# Patient Record
Sex: Female | Born: 1960 | Race: White | Hispanic: No | Marital: Married | Smoking: Never smoker
Health system: Southern US, Community
[De-identification: ages and names within clinical notes are randomized; demographics above are authoritative.]

---

## 2018-08-05 ENCOUNTER — Emergency Department (HOSPITAL_BASED_OUTPATIENT_CLINIC_OR_DEPARTMENT_OTHER): Payer: 59

## 2018-08-05 ENCOUNTER — Telehealth (HOSPITAL_BASED_OUTPATIENT_CLINIC_OR_DEPARTMENT_OTHER): Payer: Self-pay | Admitting: Emergency Medicine

## 2018-08-05 ENCOUNTER — Emergency Department (HOSPITAL_BASED_OUTPATIENT_CLINIC_OR_DEPARTMENT_OTHER)
Admission: EM | Admit: 2018-08-05 | Discharge: 2018-08-05 | Disposition: A | Discharge status: Home / Self Care | Payer: 59 | Attending: Emergency Medicine | Admitting: Emergency Medicine

## 2018-08-05 ENCOUNTER — Other Ambulatory Visit: Payer: Self-pay

## 2018-08-05 DIAGNOSIS — S0990XA Unspecified injury of head, initial encounter: Secondary | ICD-10-CM | POA: Insufficient documentation

## 2018-08-05 DIAGNOSIS — Y9389 Activity, other specified: Secondary | ICD-10-CM | POA: Diagnosis not present

## 2018-08-05 DIAGNOSIS — S0101XA Laceration without foreign body of scalp, initial encounter: Secondary | ICD-10-CM | POA: Diagnosis not present

## 2018-08-05 DIAGNOSIS — Y9259 Other trade areas as the place of occurrence of the external cause: Secondary | ICD-10-CM | POA: Insufficient documentation

## 2018-08-05 DIAGNOSIS — Z23 Encounter for immunization: Secondary | ICD-10-CM | POA: Diagnosis not present

## 2018-08-05 DIAGNOSIS — Y990 Civilian activity done for income or pay: Secondary | ICD-10-CM | POA: Diagnosis not present

## 2018-08-05 DIAGNOSIS — R55 Syncope and collapse: Secondary | ICD-10-CM | POA: Diagnosis not present

## 2018-08-05 DIAGNOSIS — W01198A Fall on same level from slipping, tripping and stumbling with subsequent striking against other object, initial encounter: Secondary | ICD-10-CM | POA: Diagnosis not present

## 2018-08-05 DIAGNOSIS — I951 Orthostatic hypotension: Secondary | ICD-10-CM

## 2018-08-05 LAB — CBG MONITORING, ED: Glucose-Capillary: 93 mg/dL (ref 70–99)

## 2018-08-05 LAB — CBC WITH DIFFERENTIAL/PLATELET
Abs Immature Granulocytes: 0.02 10*3/uL (ref 0.00–0.07)
Basophils Absolute: 0.1 10*3/uL (ref 0.0–0.1)
Basophils Relative: 1 %
Eosinophils Absolute: 0.1 10*3/uL (ref 0.0–0.5)
Eosinophils Relative: 2 %
HCT: 37.5 % (ref 36.0–46.0)
Hemoglobin: 12.4 g/dL (ref 12.0–15.0)
Immature Granulocytes: 0 %
Lymphocytes Relative: 18 %
Lymphs Abs: 1.5 10*3/uL (ref 0.7–4.0)
MCH: 32.2 pg (ref 26.0–34.0)
MCHC: 33.1 g/dL (ref 30.0–36.0)
MCV: 97.4 fL (ref 80.0–100.0)
Monocytes Absolute: 0.6 10*3/uL (ref 0.1–1.0)
Monocytes Relative: 7 %
Neutro Abs: 5.7 10*3/uL (ref 1.7–7.7)
Neutrophils Relative %: 72 %
Platelets: 280 10*3/uL (ref 150–400)
RBC: 3.85 MIL/uL — ABNORMAL LOW (ref 3.87–5.11)
RDW: 12.3 % (ref 11.5–15.5)
WBC: 7.9 10*3/uL (ref 4.0–10.5)
nRBC: 0 % (ref 0.0–0.2)

## 2018-08-05 LAB — BASIC METABOLIC PANEL
Anion gap: 7 (ref 5–15)
BUN: 15 mg/dL (ref 6–20)
CO2: 24 mmol/L (ref 22–32)
Calcium: 8.7 mg/dL — ABNORMAL LOW (ref 8.9–10.3)
Chloride: 104 mmol/L (ref 98–111)
Creatinine, Ser: 0.72 mg/dL (ref 0.44–1.00)
GFR calc Af Amer: >60 mL/min (ref >60)
GFR calc non Af Amer: >60 mL/min (ref >60)
Glucose, Bld: 102 mg/dL — ABNORMAL HIGH (ref 70–99)
Potassium: 4 mmol/L (ref 3.5–5.1)
Sodium: 135 mmol/L (ref 135–145)

## 2018-08-05 LAB — PREGNANCY, URINE: Preg Test, Ur: NEGATIVE

## 2018-08-05 LAB — TROPONIN I: Troponin I: <0.03 ng/mL (ref <0.03)

## 2018-08-05 MED ORDER — TETANUS-DIPHTH-ACELL PERTUSSIS 5-2.5-18.5 LF-MCG/0.5 IM SUSP
0.5000 mL | Freq: Once | INTRAMUSCULAR | Status: AC
  Administered 2018-08-05: 11:00:00 0.5 mL via INTRAMUSCULAR
  Filled 2018-08-05: qty 0.5

## 2018-08-05 MED ORDER — SODIUM CHLORIDE 0.9 % IV BOLUS
1000.0000 mL | Freq: Once | INTRAVENOUS | Status: AC
  Administered 2018-08-05: 13:00:00 1000 mL via INTRAVENOUS

## 2018-08-05 MED ORDER — ONDANSETRON HCL 4 MG/2ML IJ SOLN
4.0000 mg | Freq: Once | INTRAMUSCULAR | Status: AC
  Administered 2018-08-05: 12:00:00 4 mg via INTRAVENOUS
  Filled 2018-08-05: qty 2

## 2018-08-05 MED ORDER — TRAMADOL HCL 50 MG PO TABS: 50.0000 mg | ORAL_TABLET | Freq: Four times a day (QID) | ORAL | 0 refills | Status: AC | PRN

## 2018-08-05 MED ORDER — ACETAMINOPHEN 325 MG PO TABS
650.0000 mg | ORAL_TABLET | Freq: Once | ORAL | Status: AC
  Administered 2018-08-05: 11:00:00 650 mg via ORAL
  Filled 2018-08-05: qty 2

## 2018-08-05 MED ORDER — TRAMADOL HCL 50 MG PO TABS: 50.0000 mg | ORAL_TABLET | Freq: Four times a day (QID) | ORAL | 0 refills | Status: DC | PRN

## 2018-08-05 NOTE — Discharge Instructions (Addendum)
Take tramadol as needed for headache.  Stay hydrated and follow up closely with your primary care provider for further management.

## 2018-08-05 NOTE — ED Provider Notes (Signed)
ED ECG REPORT   Date: 08/05/2018  Rate: 67  Rhythm: normal sinus rhythm  QRS Axis: normal  Intervals: normal  ST/T Wave abnormalities: normal  Conduction Disutrbances:none  Narrative Interpretation:   Old EKG Reviewed: none available  I have personally reviewed the EKG tracing and agree with the computerized printout as noted.    Vanetta Mulders, MD 08/05/18 [id number]

## 2018-08-05 NOTE — Telephone Encounter (Signed)
Tramadol sent to pt's preferred pharmacy

## 2018-08-05 NOTE — Telephone Encounter (Signed)
Patient request medicine to be sent to a different pharmacy

## 2018-08-05 NOTE — ED Triage Notes (Signed)
Patient was talking to her boss and all see knew  she fell and does not know anything after that.  She stated that her boss told her that she loss consciousness for a seconds.  She think she just went down per her boss. Open skin area to posterior of her head.

## 2018-08-05 NOTE — ED Notes (Signed)
ED Provider at bedside. 

## 2018-08-05 NOTE — ED Provider Notes (Signed)
[facility] EMERGENCY DEPARTMENT Provider Note   CSN: [id number] Arrival date & time: 08/05/18  1005    History   Chief Complaint Chief Complaint  Patient presents with  . Fall    HPI Deanna Beltran is a 58 y.o. female.     The history is provided by the patient. No language interpreter was used.  Fall      58 year old female brought here via EMS for evaluation of syncope.  Patient report she was talking to her coworker at work today when she had an apparent witnessed syncopal episode in which she fell back and struck the back of her head against the ground.  She then came to, without any reported seizure activities.  She felt her head and noticed blood in the back of the scalp.  She report of sharp throbbing nonradiating pain to the back of her head, moderate in severity.  She denies any precipitating symptoms prior to the fall.  She denies any focal numbness weakness or confusion.  No complaints of chest pain heart palpitation shortness of breath abdominal pain back pain.  She felt normal this morning and did ate her breakfast.  She felt fine yesterday.  She does not have any history of syncope.  She is not on any blood thinner medication.  She cannot recall her last tetanus status.  She denies any recent change in her diet or medication.  She did report having history of iron deficiency in the past.  She did recall taking ibuprofen 800 mg this morning for her right hand pain which she attributed to carpal tunnel syndrome.  She does not take NSAIDs on a regular basis.  She denies any black or tarry stool.  No past medical history on file.  There are no active problems to display for this patient.   The histories are not reviewed yet. Please review them in the "History" navigator section and refresh this SmartLink.   OB History   No obstetric history on file.      Home Medications    Prior to Admission medications   Not on File    Family History No family history  on file.  Social History Social History   Tobacco Use  . Smoking status: Not on file  Substance Use Topics  . Alcohol use: Not on file  . Drug use: Not on file     Allergies   Patient has no allergy information on record.   Review of Systems Review of Systems  All other systems reviewed and are negative.    Physical Exam Updated Vital Signs BP 126/79   Pulse 65   Resp 13   Ht 5\' 5"  (1.651 m)   Wt 78 kg   SpO2 97%   BMI 28.62 kg/m   Physical Exam Vitals signs and nursing note reviewed.  Constitutional:      General: She is not in acute distress.    Appearance: She is well-developed.  HENT:     Head: Normocephalic.     Comments: 2 mm laceration noted to the occipital scalp not actively bleeding.  It is tender to palpation without crepitus.  No hemotympanum, no septal hematoma, no malocclusion, no midface tenderness. Eyes:     Extraocular Movements: Extraocular movements intact.     Conjunctiva/sclera: Conjunctivae normal.     Pupils: Pupils are equal, round, and reactive to light.  Neck:     Musculoskeletal: Neck supple.  Cardiovascular:     Rate and Rhythm: Normal rate  and regular rhythm.     Pulses: Normal pulses.     Heart sounds: Normal heart sounds.  Pulmonary:     Effort: Pulmonary effort is normal.  Abdominal:     Palpations: Abdomen is soft.     Tenderness: There is no abdominal tenderness.  Skin:    Findings: No rash.  Neurological:     Mental Status: She is alert and oriented to person, place, and time.     GCS: GCS eye subscore is 4. GCS verbal subscore is 5. GCS motor subscore is 6.     Cranial Nerves: Cranial nerves are intact.     Sensory: Sensation is intact.     Motor: Motor function is intact.     Coordination: Coordination is intact.     Gait: Gait is intact.  Psychiatric:        Mood and Affect: Mood normal.      ED Treatments / Results  Labs (all labs ordered are listed, but only abnormal results are displayed) Labs  Reviewed  BASIC METABOLIC PANEL - Abnormal; Notable for the following components:      Result Value   Glucose, Bld 102 (*)    Calcium 8.7 (*)    All other components within normal limits  CBC WITH DIFFERENTIAL/PLATELET - Abnormal; Notable for the following components:   RBC 3.85 (*)    All other components within normal limits  PREGNANCY, URINE  TROPONIN I  CBG MONITORING, ED    EKG None   Date: 08/05/2018  Rate: 67  Rhythm: normal sinus rhythm  QRS Axis: normal  Intervals: normal  ST/T Wave abnormalities: normal  Conduction Disutrbances: none  Narrative Interpretation:   Old EKG Reviewed: No significant changes noted     Radiology Ct Head Wo Contrast  Result Date: 08/05/2018 CLINICAL DATA:  Syncope with posterior head injury. EXAM: CT HEAD WITHOUT CONTRAST TECHNIQUE: Contiguous axial images were obtained from the base of the skull through the vertex without intravenous contrast. COMPARISON:  None. FINDINGS: Brain: No evidence of acute infarction, hemorrhage, hydrocephalus, extra-axial collection or mass lesion/mass effect. Vascular: Negative Skull: Right posterior scalp hematoma without calvarial fracture Sinuses/Orbits: No evidence of injury. IMPRESSION: 1. No evidence of intracranial injury. 2. Posterior scalp contusion without fracture. Electronically Signed   By: Marnee Spring M.D.   On: 08/05/2018 11:37    Procedures Procedures (including critical care time)  Medications Ordered in ED Medications  acetaminophen (TYLENOL) tablet 650 mg (650 mg Oral Given 08/05/18 1037)  Tdap (BOOSTRIX) injection 0.5 mL (0.5 mLs Intramuscular Given 08/05/18 1038)  ondansetron (ZOFRAN) injection 4 mg (4 mg Intravenous Given 08/05/18 1153)  sodium chloride 0.9 % bolus 1,000 mL (1,000 mLs Intravenous New Bag/Given 08/05/18 1232)     Initial Impression / Assessment and Plan / ED Course  I have reviewed the triage vital signs and the nursing notes.  Pertinent labs & imaging results that were  available during my care of the patient were reviewed by me and considered in my medical decision making (see chart for details).        BP (!) 156/95 (BP Location: Right Arm)   Pulse 72   Resp 13   Ht 5\' 5"  (1.651 m)   Wt 78 kg   SpO2 100%   BMI 28.62 kg/m    Final Clinical Impressions(s) / ED Diagnoses   Final diagnoses:  None    ED Discharge Orders    None     10:31 AM 58 y.o. female  presenting with syncope and head injury.  Patient immediately placed on cardiac, NBP, and oximetry monitors, which were all within normal limits.  ECG obtained and reviewed, which was NSR without any acute ST/T wave changes.  EKG did not display heart block, brugada syndrome, QT prolongation, Delta waves.  Neuro exam was unremarkable.  Head CT unremarkable.  Patient did not have urinary incontenence, bladder incontinence, biting of the tongue, or postictal period to suggest seizure.  Patient did not have chest pain, SOB, hypoxia, tachycardia to suggest PE or a cardiac etiology.  Patients blood sugar was unremarkable. Patient was low risk of PE based on Wells criteria. Labwork showed no concerning value, no anemia.  Troponin was negative.  History, physical exam, laboratory, and radiographic findings were discussed with the patient.  At this time, it is felt that the most likely explanation for the patient's symptoms is orthostatic syncope.  I have considered ACS, arrythmia, angina, PE, electrolyte abnormality, medication side effect, vasovagal, syncope as potential causes for patient's syncopal event.  Per [facility] Syncope Rule [facility] Syncope Rule to predict patients with serious outcomes. Carrolyn Leigh Med. 2006 May;47(5):448-54. Epub 2006 Jan 18. PubMed PMID: [id number]), patient is in the low-risk group for serious outcome.  Encourage f/u with PCP for further evaluation. Scalp laceration can be heal by secondary intention.  IVF given for orthostatic syncope  Impression: Orthostatic Syncope  Scalp laceration  Plan: outpt f/u with PCP.    Fayrene Helper, PA-C 08/05/18 1310    Vanetta Mulders, MD 08/07/18 [phone]

## 2020-01-09 IMAGING — CT CT HEAD WITHOUT CONTRAST
3 series · 15 of 47 positions shown, 18 images · non-contrast
Comparison: None.

CLINICAL DATA: Syncope with posterior head injury.

EXAM:
CT HEAD WITHOUT CONTRAST
TECHNIQUE: Contiguous axial images were obtained from the base of the skull
through the vertex without intravenous contrast.

[Series 2: head wo · axial · 0.42mm/px · z∈[-178,-53]mm · 9 of 30 slices shown, 12 images]
[im 3/30  brain]
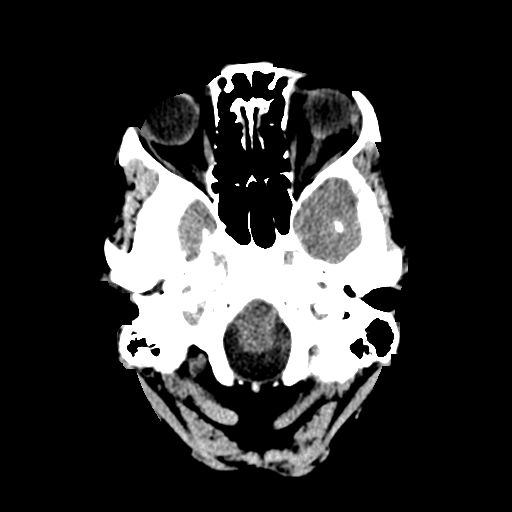
[im 3/30  bone]
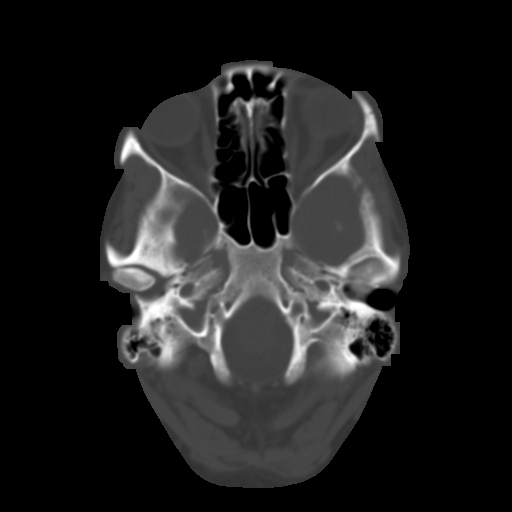
[im 6/30  brain]
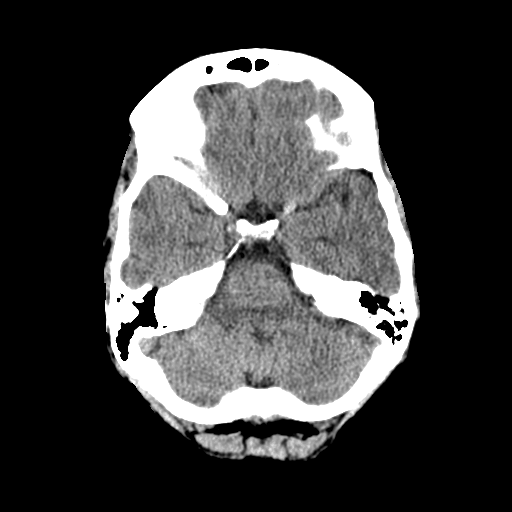
[im 9/30  brain]
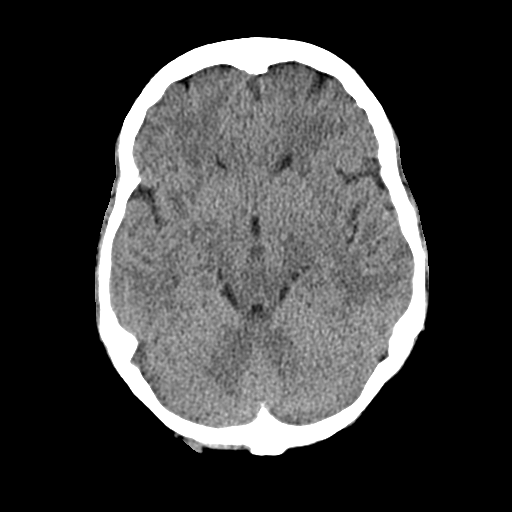
[im 12/30  brain]
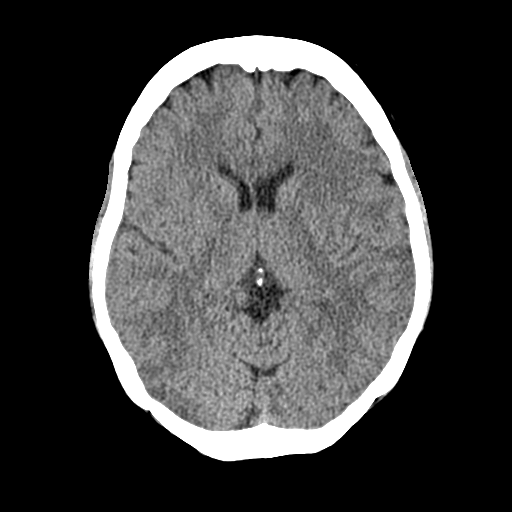
[im 16/30  brain]
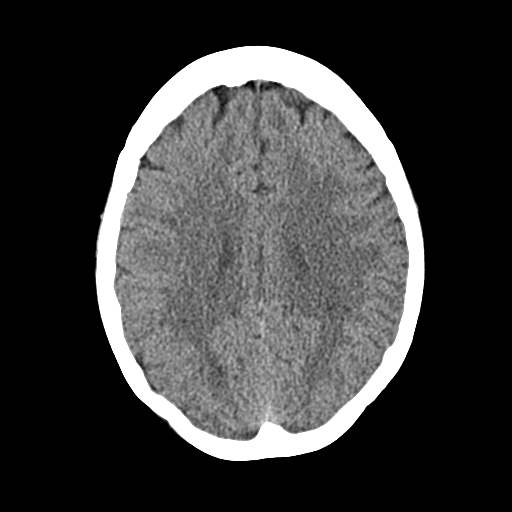
[im 16/30  bone]
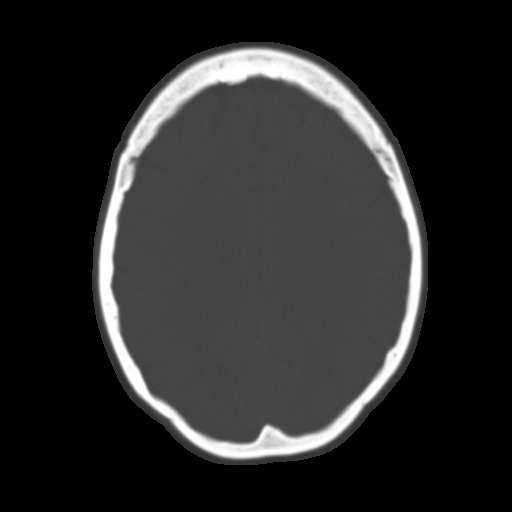
[im 19/30  brain]
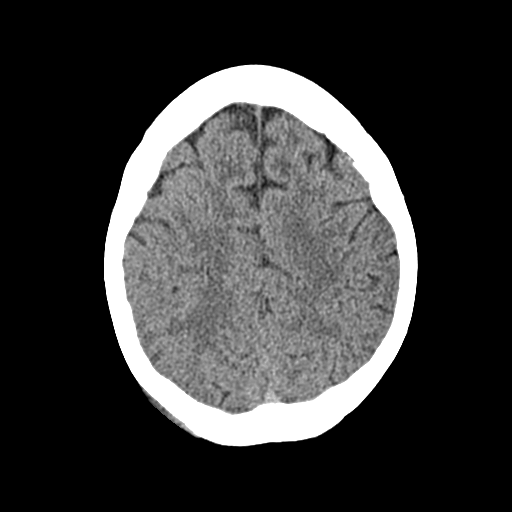
[im 22/30  brain]
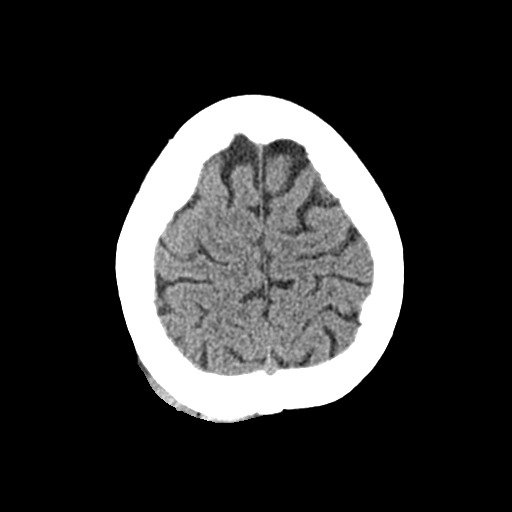
[im 25/30  brain]
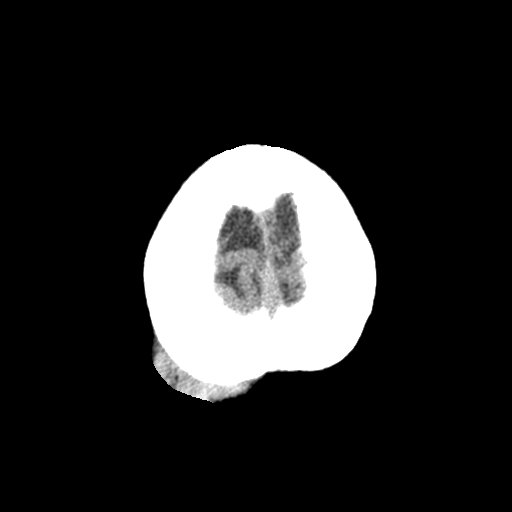
[im 28/30  brain]
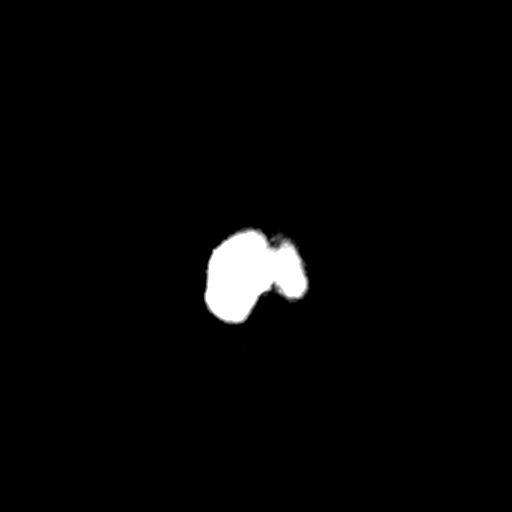
[im 28/30  bone]
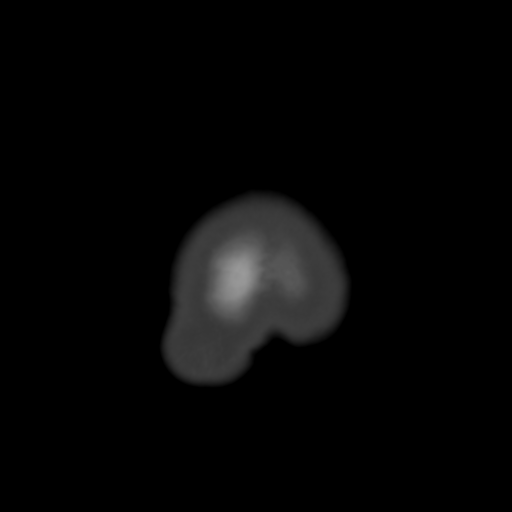

[Series 4: cor soft · coronal · 0.31mm/px · 3 of 67 slices shown]
[im 23/67  brain]
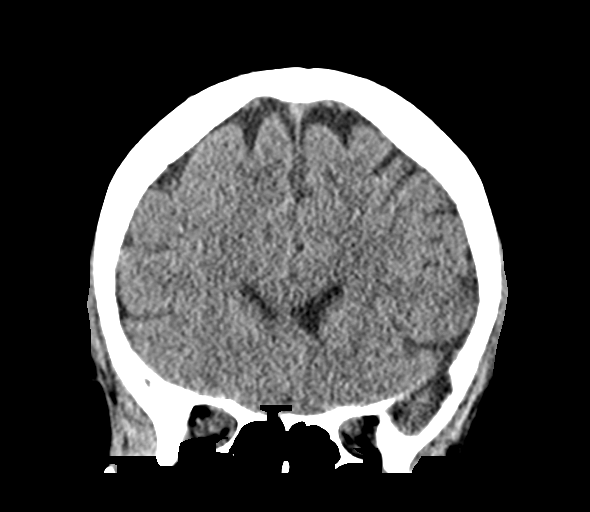
[im 30/67  brain]
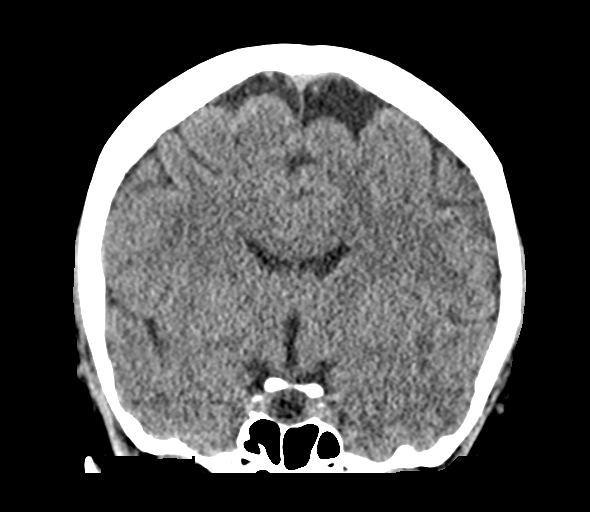
[im 37/67  brain]
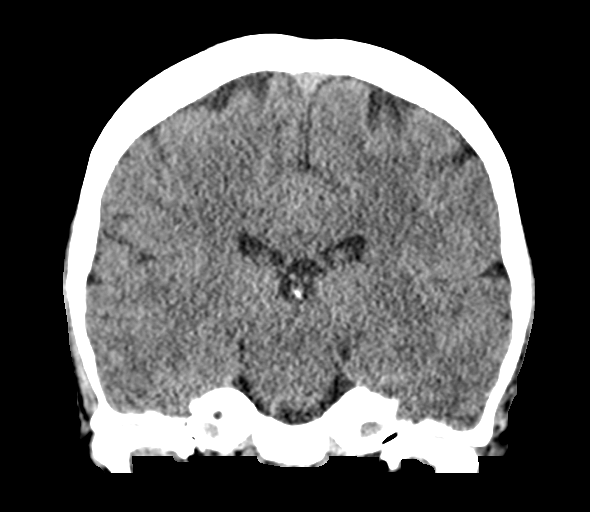

[Series 5: sag soft · sagittal · 0.31mm/px · 3 of 54 slices shown]
[im 18/54  brain]
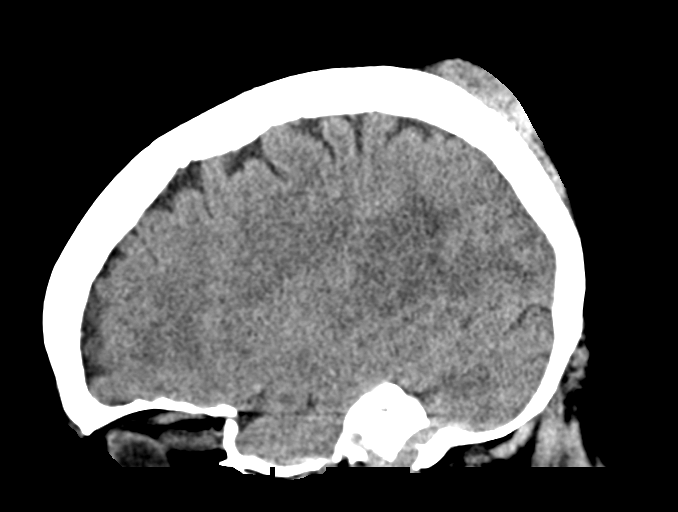
[im 27/54  brain]
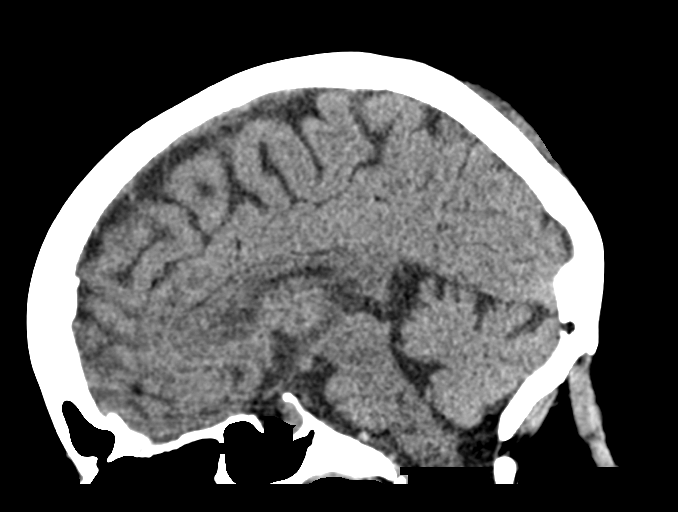
[im 36/54  brain]
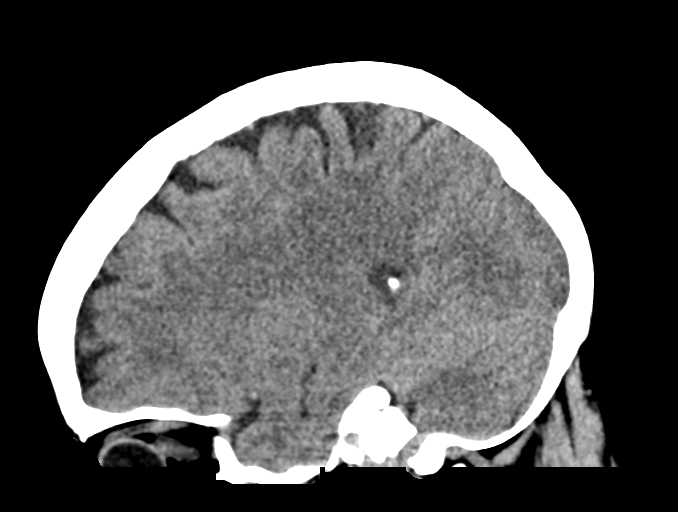

[15 of 47 positions shown; findings below may reference images not displayed]

FINDINGS: Brain: No evidence of acute infarction, hemorrhage, hydrocephalus,
extra-axial collection or mass lesion/mass effect.

Vascular: Negative

Skull: Right posterior scalp hematoma without calvarial fracture

Sinuses/Orbits: No evidence of injury.
IMPRESSION: 1. No evidence of intracranial injury.
2. Posterior scalp contusion without fracture.

## 2022-03-04 NOTE — Progress Notes (Unsigned)
[facility]  PATIENT: Deanna Beltran DOB: 09-26-1960  REFERRING DOCTOR OR PCP:  *** SOURCE: ***  _________________________________   HISTORICAL  CHIEF COMPLAINT:  No chief complaint on file.   HISTORY OF PRESENT ILLNESS:  ***   She saw Dr. Milon Beltran at [facility] neurology and was prescribed Ajovy and rizatriptan.  She was also prescribed phentermine for ADD/weight loss  She also previously saw Dr. Jobe Beltran at [place]  As part of the evaluation for the presyncope, she had a normal tilt table test 07/16/2019. ECG 08/13/2018 was normal    Imaging: CT of the head 08/05/2018 showed a right posterior scalp hematoma.  The brain was normal  REVIEW OF SYSTEMS: Constitutional: No fevers, chills, sweats, or change in appetite Eyes: No visual changes, double vision, eye pain Ear, nose and throat: No hearing loss, ear pain, nasal congestion, sore throat Cardiovascular: No chest pain, palpitations Respiratory:  No shortness of breath at rest or with exertion.   No wheezes GastrointestinaI: No nausea, vomiting, diarrhea, abdominal pain, fecal incontinence Genitourinary:  No dysuria, urinary retention or frequency.  No nocturia. Musculoskeletal:  No neck pain, back pain Integumentary: No rash, pruritus, skin lesions Neurological: as above Psychiatric: No depression at this time.  No anxiety Endocrine: No palpitations, diaphoresis, change in appetite, change in weigh or increased thirst Hematologic/Lymphatic:  No anemia, purpura, petechiae. Allergic/Immunologic: No itchy/runny eyes, nasal congestion, recent allergic reactions, rashes  ALLERGIES: Not on File  HOME MEDICATIONS:  Current Outpatient Medications:    Ascorbic Acid (VITAMIN C) 1000 MG tablet, Take 1,000 mg by mouth at bedtime., Disp: , Rfl:    calcium-vitamin D (OSCAL WITH D) 500-200 MG-UNIT tablet, Take 1 tablet by mouth daily with breakfast., Disp: , Rfl:    estradiol (ESTRACE) 1 MG  tablet, Take 1 mg by mouth daily., Disp: , Rfl:    fexofenadine-pseudoephedrine (ALLEGRA-D 24) 180-240 MG 24 hr tablet, Take 1 tablet by mouth daily., Disp: , Rfl:    lansoprazole (PREVACID) 30 MG capsule, Take 30 mg by mouth daily at 12 noon., Disp: , Rfl:    traMADol (ULTRAM) 50 MG tablet, Take 1 tablet (50 mg total) by mouth every 6 (six) hours as needed for moderate pain., Disp: 10 tablet, Rfl: 0  PAST MEDICAL HISTORY: No past medical history on file.  PAST SURGICAL HISTORY: *** The histories are not reviewed yet. Please review them in the "History" navigator section and refresh this SmartLink.  FAMILY HISTORY: No family history on file.  SOCIAL HISTORY: Social History   Socioeconomic History   Marital status: Married    Spouse name: Not on file   Number of children: Not on file   Years of education: Not on file   Highest education level: Not on file  Occupational History   Not on file  Tobacco Use   Smoking status: Not on file   Smokeless tobacco: Not on file  Substance and Sexual Activity   Alcohol use: Not on file   Drug use: Not on file   Sexual activity: Not on file  Other Topics Concern   Not on file  Social History Narrative   Not on file   Social Determinants of Health   Financial Resource Strain: Not on file  Food Insecurity: Not on file  Transportation Needs: Not on file  Physical Activity: Not on file  Stress: Not on file  Social Connections: Not on file  Intimate Partner Violence: Not on file  PHYSICAL EXAM  There were no vitals filed for this visit.  There is no height or weight on file to calculate BMI.   General: The patient is well-developed and well-nourished and in no acute distress  HEENT:  Head is Deanna Beltran/AT.  Sclera are anicteric.  Funduscopic exam shows normal optic discs and retinal vessels.  Neck: No carotid bruits are noted.  The neck is nontender.  Cardiovascular: The heart has a regular rate and rhythm with a normal S1 and  S2. There were no murmurs, gallops or rubs.    Skin: Extremities are without rash or  edema.  Musculoskeletal:  Back is nontender  Neurologic Exam  Mental status: The patient is alert and oriented x 3 at the time of the examination. The patient has apparent normal recent and remote memory, with an apparently normal attention span and concentration ability.   Speech is normal.  Cranial nerves: Extraocular movements are full. Pupils are equal, round, and reactive to light and accomodation.  Visual fields are full.  Facial symmetry is present. There is good facial sensation to soft touch bilaterally.Facial strength is normal.  Trapezius and sternocleidomastoid strength is normal. No dysarthria is noted.  The tongue is midline, and the patient has symmetric elevation of the soft palate. No obvious hearing deficits are noted.  Motor:  Muscle bulk is normal.   Tone is normal. Strength is  5 / 5 in all 4 extremities.   Sensory: Sensory testing is intact to pinprick, soft touch and vibration sensation in all 4 extremities.  Coordination: Cerebellar testing reveals good finger-nose-finger and heel-to-shin bilaterally.  Gait and station: Station is normal.   Gait is normal. Tandem gait is normal. Romberg is negative.   Reflexes: Deep tendon reflexes are symmetric and normal bilaterally.   Plantar responses are flexor.    DIAGNOSTIC DATA (LABS, IMAGING, TESTING) - I reviewed patient records, labs, notes, testing and imaging myself where available.  Lab Results  Component Value Date   WBC 7.9 08/05/2018   HGB 12.4 08/05/2018   HCT 37.5 08/05/2018   MCV 97.4 08/05/2018   PLT 280 08/05/2018      Component Value Date/Time   NA 135 08/05/2018 1030   K 4.0 08/05/2018 1030   CL 104 08/05/2018 1030   CO2 24 08/05/2018 1030   GLUCOSE 102 (H) 08/05/2018 1030   BUN 15 08/05/2018 1030   CREATININE 0.72 08/05/2018 1030   CALCIUM 8.7 (L) 08/05/2018 1030   GFRNONAA >60 08/05/2018 1030   GFRAA >60  08/05/2018 1030   No results found for: "CHOL", "HDL", "LDLCALC", "LDLDIRECT", "TRIG", "CHOLHDL" No results found for: "HGBA1C" No results found for: "VITAMINB12" No results found for: "TSH"     ASSESSMENT AND PLAN  ***   Deanna Beltran A. Epimenio Foot, MD, [facility] 03/04/2022, 5:29 PM Certified in Neurology, Clinical Neurophysiology, Sleep Medicine and Neuroimaging  [facility] [address] [place], [place] [address] [phone]

## 2022-03-05 ENCOUNTER — Encounter: Payer: Self-pay | Admitting: Neurology

## 2022-03-05 ENCOUNTER — Ambulatory Visit: Payer: Managed Care, Other (non HMO) | Admitting: Neurology

## 2022-03-05 VITALS — BP 163/93 | HR 81 | Ht 65.0 in | Wt 172.0 lb

## 2022-03-05 DIAGNOSIS — G44309 Post-traumatic headache, unspecified, not intractable: Secondary | ICD-10-CM | POA: Diagnosis not present

## 2022-03-05 DIAGNOSIS — R55 Syncope and collapse: Secondary | ICD-10-CM

## 2022-03-05 DIAGNOSIS — F0781 Postconcussional syndrome: Secondary | ICD-10-CM

## 2022-04-08 ENCOUNTER — Telehealth: Payer: Self-pay | Admitting: Neurology

## 2022-04-08 NOTE — Telephone Encounter (Signed)
Called the patient back and advised that he doesn't feel comfortable with continuing phentermine. With it being a stimulant its just not something we like to prescribe on a long term basis patient will discuss with PCP on whether they would like to continue that medication or alt options for focusing. In regards to the migraine management the patient is ok to either restart the ajovy every 30 days and use rizatriptan as needed or Dr Felecia Shelling is agreeable to both [place] for abortive therapy and qulipta as a preventative.   If pt wants [place] to continue with managing meds a 6 mth follow up is needed alternatively pt can see if pcp wants to manage.  Pt would like to think about the new medications and will call us.

## 2022-04-08 NOTE — Telephone Encounter (Signed)
Called the patient back. Upon reviewing the chart and discussing why she is taking phentermine this was ordered previously by the other neurology more so for focusing/headache management. She has been off it a while and has noticed increase in migraines so she feels it was helpful. She states that At her office visit her and Dr Felecia Shelling wanted to prolong the Ajovy over a week and see if she tolerated it in hopes of eventually weaning off. The patient states that she has had a increase in her headaches but weather has been bad which could play a role. She states that rizatriptan she uses sometimes but a lot of the time she doesn't like taking this because it can cause rebound headaches.  I discussed alternative options that are available for migraine management. Advised that we typically do not prescribe phentermine here for focusing or part of headache/migraine treatment.

## 2022-04-08 NOTE — Telephone Encounter (Signed)
Pt asking Dr. Felecia Shelling to refill Phentermine 37.5 mg, daily one tablet am. Was prescribed by previous physician that no longer practice. Can send refill to [facility] [phone].

## 2022-06-20 ENCOUNTER — Telehealth: Payer: Self-pay | Admitting: Neurology

## 2022-06-20 DIAGNOSIS — I639 Cerebral infarction, unspecified: Secondary | ICD-10-CM

## 2022-06-20 MED ORDER — AJOVY 225 MG/1.5ML ~~LOC~~ SOSY: 1.5000 mL | PREFILLED_SYRINGE | SUBCUTANEOUS | 9 refills | Status: DC

## 2022-06-20 NOTE — Telephone Encounter (Signed)
Called pt. Previous neurologist was writing rx Ajovy for her but no longer practicing and transferred care to Dr. Felecia Shelling. Confirmed she has been taking Ajovy monthly. Last seen 03/05/22. Scheduled yearly f/u for 03/06/23 at 1:00pm with Dr. Felecia Shelling. She preferred to do this than ask PCP for refills.  Aware she must f/u yearly with our office for continued med refills. E-scribed refill to [facility] as requested.

## 2022-06-20 NOTE — Telephone Encounter (Signed)
Pt is needing a refill request for her  Fremanezumab-vfrm (AJOVY) 225 MG/1.5ML SOSY sent to the [facility] in [place].

## 2022-06-24 ENCOUNTER — Other Ambulatory Visit (HOSPITAL_COMMUNITY): Payer: Self-pay

## 2022-07-08 ENCOUNTER — Other Ambulatory Visit (HOSPITAL_COMMUNITY): Payer: Self-pay

## 2022-07-08 NOTE — Telephone Encounter (Signed)
Patient Advocate Encounter   Received notification that prior authorization for AJOVY (fremanezumab-vfrm) injection 225MG /1.5ML syringes is required.   PA submitted on 07/08/2022 Key [id number] [employer] ePA Form Status is pending

## 2022-07-08 NOTE — Telephone Encounter (Signed)
The notification that pt's fax was received was relayed to pt.  Pt informed it could take a week or longer re: the needed PA.  Pt states she has been without this injection since the 1st of the month. Pt states it is not an option to wait a week or more .  Pt is asking for a call to discuss if this request can be submitted as a high priority. Pt asking for a call from RN one way or the other re: this request being submitted.

## 2022-07-08 NOTE — Telephone Encounter (Signed)
Can you please submit PA Ajovy urgently per pt request?

## 2022-07-09 ENCOUNTER — Telehealth: Payer: Self-pay | Admitting: Neurology

## 2022-07-09 NOTE — Telephone Encounter (Signed)
Pt called saying she just came from the pharmacy and they gave her the syringe instead of the auto injector for her  Fremanezumab-vfrm (AJOVY) 225 MG/1.5ML SOSY  Pt states the pharmacy is needing a new Rx stating this. Please advise.

## 2022-07-09 NOTE — Telephone Encounter (Signed)
Please call pt and let her know prior auth approved.  She should now be able to pick up from the pharmacy.  "PA Case: [id number], Status: Approved, Coverage Starts on: 07/08/2022 12:00:00 AM, Coverage Ends on: 07/08/2023 12:00:00 AM."

## 2022-07-10 MED ORDER — AJOVY 225 MG/1.5ML ~~LOC~~ SOAJ: 1.5000 mL | SUBCUTANEOUS | 9 refills | Status: DC

## 2022-07-10 NOTE — Telephone Encounter (Signed)
Rx sent in for Ajovy 225 mg/1.5ML AJ

## 2022-07-10 NOTE — Telephone Encounter (Signed)
Submitted urgent PA on [employer] for auto-injector instead/Ajovy. LAG:[id number]. Waiting on determination from [employer].

## 2022-07-11 ENCOUNTER — Other Ambulatory Visit (HOSPITAL_COMMUNITY): Payer: Self-pay

## 2022-07-11 NOTE — Telephone Encounter (Signed)
Checked status, PA determination still pending w/ insurance

## 2022-07-15 NOTE — Telephone Encounter (Signed)
Phone room- please call pt and let her know Ajovy approved and she should now be able to pick up from the pharmacy.  Received fax from [employer] that PA approved through 07/12/23. [id number]

## 2022-07-15 NOTE — Telephone Encounter (Signed)
Called pt and LVM with information. Left office number for her to call if she has any questions.

## 2022-09-03 ENCOUNTER — Telehealth: Payer: Self-pay | Admitting: Neurology

## 2022-09-03 ENCOUNTER — Other Ambulatory Visit: Payer: Self-pay | Admitting: Neurology

## 2022-09-03 MED ORDER — RIZATRIPTAN BENZOATE 10 MG PO TABS: 10.0000 mg | ORAL_TABLET | ORAL | 11 refills | Status: DC | PRN

## 2022-09-03 NOTE — Telephone Encounter (Signed)
Pt states she has been out of this medication:rizatriptan (MAXALT) 10 MG tablet  for a week, she would like it called into:  [facility] [phone]

## 2022-09-03 NOTE — Telephone Encounter (Signed)
Pt last seen 03/05/2022  Maxalt last filled 07/08/2022

## 2023-03-06 ENCOUNTER — Ambulatory Visit: Payer: Managed Care, Other (non HMO) | Admitting: Neurology

## 2023-03-06 ENCOUNTER — Encounter: Payer: Self-pay | Admitting: Neurology

## 2023-03-06 VITALS — BP 125/83 | HR 67 | Ht 65.0 in | Wt 180.0 lb

## 2023-03-06 DIAGNOSIS — R42 Dizziness and giddiness: Secondary | ICD-10-CM | POA: Diagnosis not present

## 2023-03-06 DIAGNOSIS — F0781 Postconcussional syndrome: Secondary | ICD-10-CM

## 2023-03-06 DIAGNOSIS — R55 Syncope and collapse: Secondary | ICD-10-CM | POA: Diagnosis not present

## 2023-03-06 DIAGNOSIS — G43611 Persistent migraine aura with cerebral infarction, intractable, with status migrainosus: Secondary | ICD-10-CM | POA: Diagnosis not present

## 2023-03-06 DIAGNOSIS — I639 Cerebral infarction, unspecified: Secondary | ICD-10-CM

## 2023-03-06 MED ORDER — AJOVY 225 MG/1.5ML ~~LOC~~ SOAJ: 1.5000 mL | SUBCUTANEOUS | 11 refills | Status: AC

## 2023-03-06 MED ORDER — RIZATRIPTAN BENZOATE 10 MG PO TABS: 10.0000 mg | ORAL_TABLET | ORAL | 11 refills | Status: DC | PRN

## 2023-03-06 MED ORDER — ZONISAMIDE 50 MG PO CAPS: 50.0000 mg | ORAL_CAPSULE | Freq: Every day | ORAL | 11 refills | Status: DC

## 2023-03-06 NOTE — Progress Notes (Signed)
[facility]  PATIENT: Deanna Beltran DOB:  06/27/1960  REFERRING DOCTOR OR PCP:  Sharyne Richters, MD; Lolita Cram, NP SOURCE: Patient, notes from [facility], neurology/cardiology test results, imaging and lab report, CT scan personally reviewed  _________________________________   HISTORICAL  CHIEF COMPLAINT:  Chief Complaint  Patient presents with   Follow-up    Pt in room 10, husband in room. Here for migraines and syncope episodes. Patient said not much has changed. Patient said no recent syncope episode, no dizziness.     HISTORY OF PRESENT ILLNESS:  Deanna Beltran with episode of pre-syncope and postconcussive headaches/migraine  Update 03/06/2023: She denies any recent episode of syncope but has had pre-syncope spells almost every day, especially in the mornings while driving into work, especially if in bright light.    She is taking Ajovy monthly.  She feels she has more spells the week before the shots  She tried to reduce Ajovy but the headaches worsened so she went back to the q 4 week injections.   Most of these are mild but she occasionally has more severe ones and pain intensifies in the occiput.  She takes rizatriptan a couple times a month, usually towards the end of each monthly injection cycle.    She is a light sleeper so wakes up at night several times.     In the past, she had been placed on amitriptyline but did not tolerate.   She may have been on topiramate but not sure what response was  History of Head injury and headaches She reports having a post concussive headache .  She fell and hit her head 08/05/2018.    She had a normal morning and was standing, talking to an associate.   Her associate saw her eyes roll back and ten she passed out.   There was no shaking/jerking.   She very rapidly regained consciousness and full awreness (just a few seconds LOC)    She went to the ED and had a CT (brian normal showed scalp hematoma.     She recalls they  checked orthostatics but cause of syncope was never found.    She saw Dr. Jobe Marker at [facility] as an outpatient.   EEG was normal.     Since the injury, she reports HAs.  These were worse right after the injury and have done better the last year.     She then saw Dr. Milon Dikes at [facility] neurology and was prescribed Ajovy and rizatriptan.  She was also prescribed phentermine for ADD/weight loss.    Initially, headaches were associated with nausea but have not been for a whole.   Bright lights used to increase the pain and she tries to avoid fluorescent lights.    Pain is over the eyes/forehead.     When a HA is present, moving does not alter the pain.   She feels they are better on Ajovy.     Episodes of lightheadedness: She has not had other episodes of syncope but has had some spells of lightheadedness.  ECG 08/13/2018 was normal.    As part of the evaluation for the presyncope, she had a normal tilt table test 07/16/2019.   She had a 1 month EKG monitor,      Anemia/iron She did not have anemia at the time of the injury but ferritin has been low subsequently. (08/03/20)/   She had needed iron infusions many years ago and had a hysterectomy. Marland Kitchen  She is on iron now.      Imaging: CT of the head 08/05/2018 showed a right posterior scalp hematoma.  The brain was normal  REVIEW OF SYSTEMS: Constitutional: No fevers, chills, sweats, or change in appetite Eyes: No visual changes, double vision, eye pain Ear, nose and throat: No hearing loss, ear pain, nasal congestion, sore throat Cardiovascular: No chest pain, palpitations Respiratory:  No shortness of breath at rest or with exertion.   No wheezes GastrointestinaI: No nausea, vomiting, diarrhea, abdominal pain, fecal incontinence Genitourinary:  No dysuria, urinary retention or frequency.  No nocturia. Musculoskeletal:  No neck pain, back pain Integumentary: No rash, pruritus, skin lesions Neurological: as above Psychiatric: No  depression at this time.  No anxiety Endocrine: No palpitations, diaphoresis, change in appetite, change in weigh or increased thirst Hematologic/Lymphatic:  No anemia, purpura, petechiae. Allergic/Immunologic: No itchy/runny eyes, nasal congestion, recent allergic reactions, rashes  ALLERGIES: Allergies  Allergen Reactions   Amitriptyline Swelling and Anxiety    HOME MEDICATIONS:  Current Outpatient Medications:    Ascorbic Acid (VITAMIN C) 1000 MG tablet, Take 1,000 mg by mouth at bedtime., Disp: , Rfl:    calcium-vitamin D (OSCAL WITH D) 500-200 MG-UNIT tablet, Take 1 tablet by mouth daily with breakfast., Disp: , Rfl:    fexofenadine-pseudoephedrine (ALLEGRA-D 24) 180-240 MG 24 hr tablet, Take 1 tablet by mouth daily., Disp: , Rfl:    IRON, FERROUS SULFATE, PO, Take by mouth. 1 pill by mouth daily, Disp: , Rfl:    lansoprazole (PREVACID) 30 MG capsule, Take 30 mg by mouth daily at 12 noon., Disp: , Rfl:    traMADol (ULTRAM) 50 MG tablet, Take 1 tablet (50 mg total) by mouth every 6 (six) hours as needed for moderate pain., Disp: 10 tablet, Rfl: 0   zonisamide (ZONEGRAN) 50 MG capsule, Take 1 capsule (50 mg total) by mouth daily., Disp: 30 capsule, Rfl: 11   Fremanezumab-vfrm (AJOVY) 225 MG/1.5ML SOAJ, Inject 1.5 mLs into the skin every 30 (thirty) days., Disp: 1.5 mL, Rfl: 11   rizatriptan (MAXALT) 10 MG tablet, Take 1 tablet (10 mg total) by mouth as needed., Disp: 10 tablet, Rfl: 11  PAST MEDICAL HISTORY: No past medical history on file.  PAST SURGICAL HISTORY: No past surgical history on file.  FAMILY HISTORY: Family History  Problem Relation Age of Onset   Cancer - Other Mother    Congestive Heart Failure Father    Asthma Sister    Alzheimer's disease Maternal Grandmother     SOCIAL HISTORY: Social History   Socioeconomic History   Marital status: Married    Spouse name: Not on file   Number of children: Not on file   Years of education: Not on file   Highest  education level: Not on file  Occupational History   Not on file  Tobacco Use   Smoking status: Never   Smokeless tobacco: Never  Substance and Sexual Activity   Alcohol use: Never   Drug use: Never   Sexual activity: Not on file  Other Topics Concern   Not on file  Social History Narrative   Not on file   Social Determinants of Health   Financial Resource Strain: Patient Declined (11/11/2022)   Received from [facility]   Overall Financial Resource Strain (CARDIA)    Difficulty of Paying Living Expenses: Patient declined  Food Insecurity: Patient Declined (11/11/2022)   Received from [facility]   Hunger Vital Sign    Worried About Running Out  of Food in the Last Year: Patient declined    Ran Out of Food in the Last Year: Patient declined  Transportation Needs: Patient Declined (11/11/2022)   Received [facility] - Transportation    Lack of Transportation (Medical): Patient declined    Lack of Transportation (Non-Medical): Patient declined  Physical Activity: Unknown (11/11/2022)   Received from [facility]   Exercise Vital Sign    Days of Exercise per Week: Patient declined    Minutes of Exercise per Session: Not on file  Stress: Patient Declined (11/11/2022)   Received from [facility] - Occupational Stress Questionnaire    Feeling of Stress : Patient declined  Social Connections: Unknown (08/10/2021)   Received from [facility]   Social Network    Social Network: Not on file  Intimate Partner Violence: Unknown (01/30/2022)   Received from [facility]   HITS    Over the last 12 months how often did your partner physically hurt you?: Never    Insult or Talk Down To: Not on file    Over the last 12 months how often did your partner threaten you with physical harm?: Never    Scream or Curse: Not on file       PHYSICAL EXAM  Vitals:   03/06/23 1304 03/06/23 1310  BP: (!) 151/96 125/83  Pulse:  67   Weight: 180 lb (81.6 kg)   Height: 5\' 5"  (1.651 m)     Body mass index is 29.95 kg/m.   General: The patient is well-developed and well-nourished and in no acute distress  HEENT:  Head is [place]/AT.  Sclera are anicteric.  Funduscopic exam shows normal optic discs and retinal vessels.  Neck:    The neck is nontender.  Neck has good range of motion.  Skin: Extremities are without rash or  edema.  Musculoskeletal:  Back is nontender  Neurologic Exam  Mental status: The patient is alert and oriented x 3 at the time of the examination. The patient has apparent normal recent and remote memory, with an apparently normal attention span and concentration ability.   Speech is normal.  Cranial nerves: Extraocular movements are full.  There is good facial sensation to soft touch bilaterally.Facial strength is normal.  Trapezius and sternocleidomastoid strength is normal. No dysarthria is noted.  Hearing was symmetric.  The Weber did not lateralize.  Motor:  Muscle bulk is normal.   Tone is normal. Strength is  5 / 5 in all 4 extremities.   Sensory: Sensory testing is intact to pinprick, soft touch and vibration sensation in all 4 extremities.  Coordination: Cerebellar testing reveals good finger-nose-finger and heel-to-shin bilaterally.  Gait and station: Station is normal.   Gait is normal. Tandem gait is normal. Romberg is negative.   Reflexes: Deep tendon reflexes are symmetric and normal bilaterally.          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 Trial of zonisamide to see if that helps the migraines further. Stay active and exercise as tolerated. Return in 1 year or sooner if there are new or worsening neurologic symptoms.    Raudel Bazen A. Epimenio Foot, MD, [facility] 03/06/2023, 5:02 PM Certified in  Neurology, Clinical Neurophysiology, Sleep Medicine and Neuroimaging  [facility] [address] [place], [place] [address] [phone] 

## 2023-04-04 ENCOUNTER — Encounter: Payer: Self-pay | Admitting: Neurology

## 2023-04-22 ENCOUNTER — Other Ambulatory Visit: Payer: Self-pay | Admitting: *Deleted

## 2023-04-22 NOTE — Telephone Encounter (Signed)
I called and spoke to Deanna Beltran.  I did not see this until today, Deanna Beltran had not seen message as well.  She is doing ok.  I instructed on the nortriptyline alternative from zonisamide.  Deanna Beltran will look up and see and let us know.  I did go over common SE for her.  She states her migraines are helped with rizatriptan (but she is wary about taking too often for potential for rebound headaches).  I relayed that she can take 1 tablet onset and another 2 hr if needed (but only 2 times weekly ) to prevent rebound.  She says weather changes  barometric pressure is a real cause for her migraines.  She appreciated call back.  Will let us know.  She will message pod 1 for any later messages.

## 2023-04-29 ENCOUNTER — Other Ambulatory Visit: Payer: Self-pay

## 2023-04-29 ENCOUNTER — Telehealth: Payer: Self-pay

## 2023-04-29 MED ORDER — NORTRIPTYLINE HCL 25 MG PO CAPS: 25.0000 mg | ORAL_CAPSULE | Freq: Every day | ORAL | 1 refills | Status: AC

## 2023-04-29 NOTE — Telephone Encounter (Signed)
Please See Phone note 04/29/2023

## 2023-04-29 NOTE — Telephone Encounter (Signed)
Mack Hook spoke w/ pt and will document on this

## 2023-04-29 NOTE — Telephone Encounter (Signed)
LVM asking pt to call office

## 2023-04-29 NOTE — Telephone Encounter (Signed)
Spoke with pt and she stated that she wanted to know if Nortriptyline 25mg  was going to replace her Zonisamide, told pt that it was per Dr. Epimenio Foot. Pt stated that she would try it and if her symptoms got worse she would stop medication and let us know.   Per Dr. Epimenio Foot sent in Nortriptyline 25mg  to pt's pharmacy

## 2023-05-21 ENCOUNTER — Encounter: Payer: Self-pay | Admitting: Neurology

## 2023-06-13 ENCOUNTER — Telehealth: Payer: Self-pay

## 2023-06-13 ENCOUNTER — Other Ambulatory Visit (HOSPITAL_COMMUNITY): Payer: Self-pay

## 2023-06-13 NOTE — Telephone Encounter (Signed)
 Pharmacy Patient Advocate Encounter   Received notification from CoverMyMeds that prior authorization for AJOVY (fremanezumab-vfrm) injection 225MG /1.5ML auto-injectors is required/requested.   Insurance verification completed.   The patient is insured through [facility] .   KEY [id number]  Per test claim: The current 28 day co-pay is, $24.98.  No PA needed at this time. This test claim was processed through [facility]- copay amounts may vary at other pharmacies due to pharmacy/plan contracts, or as the patient moves through the different stages of their insurance plan.     PA in effective until 11-03-2023

## 2023-08-06 ENCOUNTER — Telehealth: Payer: Self-pay

## 2023-08-06 ENCOUNTER — Other Ambulatory Visit (HOSPITAL_COMMUNITY): Payer: Self-pay

## 2023-08-06 NOTE — Telephone Encounter (Signed)
 Pharmacy Patient Advocate Encounter   Received notification from Fax that prior authorization for AJOVY  (fremanezumab -vfrm) injection 225MG /1.5ML auto-injectors is required/requested.   Insurance verification completed.   The patient is insured through [employer] .   Per test claim:  [id number]

## 2023-08-06 NOTE — Telephone Encounter (Signed)
 Pharmacy Patient Advocate Encounter  Received notification from [employer] that Prior Authorization for AJOVY  (fremanezumab -vfrm) injection 225MG /1.5ML auto-injectors has been APPROVED from 08/06/2023 to 08/05/2024. Ran test claim, Copay is $24.98. This test claim was processed through [facility]- copay amounts may vary at other pharmacies due to pharmacy/plan contracts, or as the patient moves through the different stages of their insurance plan.   PA #/Case ID/Reference #: PA Case ID #: [id number]

## 2023-09-08 ENCOUNTER — Other Ambulatory Visit: Payer: Self-pay | Admitting: Neurology

## 2023-09-08 NOTE — Telephone Encounter (Signed)
 Last seen on 03/06/23 Follow up scheduled on 03/11/24

## 2024-02-03 ENCOUNTER — Other Ambulatory Visit: Payer: Self-pay | Admitting: Neurology

## 2024-02-03 ENCOUNTER — Other Ambulatory Visit: Payer: Self-pay

## 2024-03-01 ENCOUNTER — Telehealth: Payer: Self-pay | Admitting: Neurology

## 2024-03-01 NOTE — Telephone Encounter (Signed)
 Appointment details confirmed

## 2024-03-02 ENCOUNTER — Telehealth: Payer: Self-pay | Admitting: Neurology

## 2024-03-02 NOTE — Telephone Encounter (Signed)
 Pt called to cancel appt  due to conflict  Appt Canceled

## 2024-03-11 ENCOUNTER — Ambulatory Visit: Payer: Managed Care, Other (non HMO) | Admitting: Neurology

## 2024-04-13 ENCOUNTER — Other Ambulatory Visit: Payer: Self-pay | Admitting: Neurology
